# Patient Record
Sex: Male | Born: 1995
Health system: Southern US, Community
[De-identification: ages and names within clinical notes are randomized; demographics above are authoritative.]

## PROBLEM LIST (undated history)

## (undated) DIAGNOSIS — M2142 Flat foot [pes planus] (acquired), left foot: Secondary | ICD-10-CM

## (undated) DIAGNOSIS — F913 Oppositional defiant disorder: Secondary | ICD-10-CM

## (undated) DIAGNOSIS — M2141 Flat foot [pes planus] (acquired), right foot: Secondary | ICD-10-CM

## (undated) DIAGNOSIS — F909 Attention-deficit hyperactivity disorder, unspecified type: Secondary | ICD-10-CM

## (undated) DIAGNOSIS — Z973 Presence of spectacles and contact lenses: Secondary | ICD-10-CM

## (undated) HISTORY — DX: Presence of spectacles and contact lenses: Z97.3

## (undated) HISTORY — DX: Attention-deficit hyperactivity disorder, unspecified type: F90.9

## (undated) HISTORY — DX: Flat foot (pes planus) (acquired), left foot: M21.42

## (undated) HISTORY — DX: Oppositional defiant disorder: F91.3

## (undated) HISTORY — DX: Flat foot (pes planus) (acquired), right foot: M21.41

---
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
## 2018-02-27 ENCOUNTER — Encounter (HOSPITAL_COMMUNITY): Payer: Self-pay | Admitting: Emergency Medicine

## 2018-02-27 ENCOUNTER — Ambulatory Visit (HOSPITAL_COMMUNITY)
Admission: EM | Admit: 2018-02-27 | Discharge: 2018-02-27 | Disposition: A | Discharge status: Home / Self Care | Attending: Physician Assistant | Admitting: Physician Assistant

## 2018-02-27 DIAGNOSIS — L02214 Cutaneous abscess of groin: Secondary | ICD-10-CM

## 2018-02-27 MED ORDER — SULFAMETHOXAZOLE-TRIMETHOPRIM 800-160 MG PO TABS: 1.0000 | ORAL_TABLET | Freq: Two times a day (BID) | ORAL | 0 refills | Status: AC

## 2018-02-27 NOTE — ED Provider Notes (Signed)
[facility]    CSN: [id number] Arrival date & time: 02/27/18  1150     History   Chief Complaint Chief Complaint  Patient presents with  . Abscess    HPI Thomas Rasmussen is a 22 y.o. male.   The history is provided by the patient. No language interpreter was used.  Abscess  Location:  Pelvis Pelvic abscess location:  Groin Size:  3 Abscess quality: draining ( cm) and warmth   Red streaking: no   Progression:  Worsening Chronicity:  New Relieved by:  Nothing Worsened by:  Nothing Ineffective treatments:  None tried Pt reports he has a swollen area in his groin.   Past Medical History:  Diagnosis Date  . ADHD (attention deficit hyperactivity disorder)   . Asthma   . Flat feet   . Oppositional defiant disorder   . Wears glasses     Patient Active Problem List   Diagnosis Date Noted  . ADHD (attention deficit hyperactivity disorder), combined type 06/15/2011  . ODD (oppositional defiant disorder) 06/15/2011    History reviewed. No pertinent surgical history.     Home Medications    Prior to Admission medications   Medication Sig Start Date End Date Taking? Authorizing Provider  sulfamethoxazole-trimethoprim (BACTRIM DS,SEPTRA DS) 800-160 MG tablet Take 1 tablet by mouth 2 (two) times daily for 7 days. 02/27/18 03/06/18  Elson AreasSofia, Leslie K, PA-C    Family History History reviewed. No pertinent family history.  Social History Social History   Tobacco Use  . Smoking status: Never Smoker  Substance Use Topics  . Alcohol use: No  . Drug use: No     Allergies   Peanut-containing drug products   Review of Systems Review of Systems  All other systems reviewed and are negative.    Physical Exam Triage Vital Signs ED Triage Vitals [02/27/18 1230]  Enc Vitals Group     BP 133/81     Pulse Rate (!) 55     Resp 18     Temp 98.2 F (36.8 C)     Temp Source Oral     SpO2 100 %     Weight      Height      Head Circumference      Peak  Flow      Pain Score      Pain Loc      Pain Edu?      Excl. in GC?    No data found.  Updated Vital Signs BP 133/81 (BP Location: Left Arm)   Pulse (!) 55   Temp 98.2 F (36.8 C) (Oral)   Resp 18   SpO2 100%   Visual Acuity Right Eye Distance:   Left Eye Distance:   Bilateral Distance:    Right Eye Near:   Left Eye Near:    Bilateral Near:     Physical Exam  Constitutional: He appears well-developed and well-nourished.  Cardiovascular: Normal rate.  Pulmonary/Chest: Effort normal.  Musculoskeletal: Normal range of motion.  Neurological: He is alert.  Skin: Skin is warm.  Tender swollen area right groin in crease.  No fluctuance,   Psychiatric: He has a normal mood and affect.  Nursing note and vitals reviewed.    UC Treatments / Results  Labs (all labs ordered are listed, but only abnormal results are displayed) Labs Reviewed - No data to display  EKG None  Radiology No results found.  Procedures Procedures (including critical care time)  Medications Ordered  in UC Medications - No data to display  Initial Impression / Assessment and Plan / UC Course  I have reviewed the triage vital signs and the nursing notes.  Pertinent labs & imaging results that were available during my care of the patient were reviewed by me and considered in my medical decision making (see chart for details).     Pt counseled on soaking.  Rx for Bactrim  Final Clinical Impressions(s) / UC Diagnoses   Final diagnoses:  Abscess, groin     Discharge Instructions     Soak area 20 minutes 4 times a day  return if worsening.  Recheck in 2-3 days if not improving   ED Prescriptions    Medication Sig Dispense Auth. Provider   sulfamethoxazole-trimethoprim (BACTRIM DS,SEPTRA DS) 800-160 MG tablet Take 1 tablet by mouth 2 (two) times daily for 7 days. 20 tablet Elson AreasSofia, Leslie K, [place]     Controlled Substance Prescriptions [place] Controlled Substance Registry consulted? Not  Applicable  An After Visit Summary was printed and given to the patient.    Elson AreasSofia, Leslie K, [place] 02/27/18 1311

## 2018-02-27 NOTE — Discharge Instructions (Signed)
Soak area 20 minutes 4 times a day  return if worsening.  Recheck in 2-3 days if not improving

## 2018-02-27 NOTE — ED Triage Notes (Signed)
Pt sts abscess to right thigh with some drainage yesterday; pt sts swelling and pain

## 2020-04-26 ENCOUNTER — Ambulatory Visit (HOSPITAL_COMMUNITY)
Admission: EM | Admit: 2020-04-26 | Discharge: 2020-04-26 | Disposition: A | Discharge status: Home / Self Care | Attending: Family Medicine | Admitting: Family Medicine

## 2020-04-26 ENCOUNTER — Encounter (HOSPITAL_COMMUNITY): Payer: Self-pay

## 2020-04-26 DIAGNOSIS — L0291 Cutaneous abscess, unspecified: Secondary | ICD-10-CM

## 2020-04-26 MED ORDER — SULFAMETHOXAZOLE-TRIMETHOPRIM 800-160 MG PO TABS: 1.0000 | ORAL_TABLET | Freq: Two times a day (BID) | ORAL | 0 refills | Status: AC

## 2020-04-26 MED ORDER — LIDOCAINE-EPINEPHRINE 1 %-1:100000 IJ SOLN
INTRAMUSCULAR | Status: AC
  Filled 2020-04-26: qty 1

## 2020-04-26 NOTE — ED Triage Notes (Addendum)
Patient in with c/o of abscess on left buttock that was noticed last week. States that it was draining last week but it stopped. States that pain and swelling persist and pain is worse on palpation or if he sits  Denies n/v, fever,

## 2020-04-26 NOTE — Discharge Instructions (Signed)
Follow up with Primary Care in the next few weeks to re-check the area.

## 2020-04-26 NOTE — ED Provider Notes (Signed)
[facility]    CSN: [id number] Arrival date & time: 04/26/20  1136      History   Chief Complaint Chief Complaint  Patient presents with  . Abscess    HPI Thomas Rasmussen is a 24 y.o. male.   Here today with a painful abscess on left buttock that has been ongoing for about 2 weeks now. Was able to express some thick material 2 days ago but nothing since. Some redness, warmth but no fever, chills, sweats, body aches. Not trying anything OTC for sxs. Has had this issue once before.      Past Medical History:  Diagnosis Date  . ADHD (attention deficit hyperactivity disorder)   . Asthma   . Flat feet   . Oppositional defiant disorder   . Wears glasses     Patient Active Problem List   Diagnosis Date Noted  . ADHD (attention deficit hyperactivity disorder), combined type 06/15/2011  . ODD (oppositional defiant disorder) 06/15/2011    History reviewed. No pertinent surgical history.     Home Medications    Prior to Admission medications   Medication Sig Start Date End Date Taking? Authorizing Provider  sulfamethoxazole-trimethoprim (BACTRIM DS) 800-160 MG tablet Take 1 tablet by mouth 2 (two) times daily for 7 days. 04/26/20 05/03/20  Particia Nearing, PA-C    Family History Family History  Problem Relation Age of Onset  . Diabetes Father     Social History Social History   Tobacco Use  . Smoking status: Never Smoker  Substance Use Topics  . Alcohol use: Yes  . Drug use: No     Allergies   Peanut-containing drug products   Review of Systems Review of Systems PER HPI    Physical Exam Triage Vital Signs ED Triage Vitals  Enc Vitals Group     BP 04/26/20 1318 132/89     Pulse Rate 04/26/20 1318 (!) 58     Resp 04/26/20 1318 17     Temp 04/26/20 1318 98.2 F (36.8 C)     Temp Source 04/26/20 1318 Oral     SpO2 04/26/20 1318 100 %     Weight --      Height --      Head Circumference --      Peak Flow --      Pain Score  04/26/20 1313 6     Pain Loc --      Pain Edu? --      Excl. in GC? --    No data found.  Updated Vital Signs BP 132/89 (BP Location: Left Arm)   Pulse (!) 58   Temp 98.2 F (36.8 C) (Oral)   Resp 17   SpO2 100%   Visual Acuity Right Eye Distance:   Left Eye Distance:   Bilateral Distance:    Right Eye Near:   Left Eye Near:    Bilateral Near:     Physical Exam Vitals and nursing note reviewed.  Constitutional:      Appearance: Normal appearance.  HENT:     Head: Atraumatic.  Eyes:     Extraocular Movements: Extraocular movements intact.     Conjunctiva/sclera: Conjunctivae normal.  Cardiovascular:     Rate and Rhythm: Normal rate and regular rhythm.  Pulmonary:     Effort: Pulmonary effort is normal.     Breath sounds: Normal breath sounds.  Musculoskeletal:        General: Normal range of motion.     Cervical  back: Normal range of motion and neck supple.  Skin:    General: Skin is warm and dry.     Comments: 2.5 cm semi fluctuant lump left buttock, no erythema or active drainage.   Neurological:     General: No focal deficit present.     Mental Status: He is oriented to person, place, and time.  Psychiatric:        Mood and Affect: Mood normal.        Thought Content: Thought content normal.        Judgment: Judgment normal.      UC Treatments / Results  Labs (all labs ordered are listed, but only abnormal results are displayed) Labs Reviewed - No data to display  EKG   Radiology No results found.  Procedures Incision and Drainage  Date/Time: 04/26/2020 1:54 PM Performed by: Particia Nearing, PA-C Authorized by: Particia Nearing, PA-C   Consent:    Consent obtained:  Verbal   Consent given by:  Patient   Risks discussed:  Bleeding, incomplete drainage, infection and pain   Alternatives discussed:  Alternative treatment and referral Location:    Type:  Abscess   Location: left buttock. Pre-procedure details:    Skin  preparation:  Chloraprep Anesthesia (see MAR for exact dosages):    Anesthesia method:  Local infiltration   Local anesthetic:  Lidocaine 2% WITH epi Procedure type:    Complexity:  Simple Procedure details:    Needle aspiration: no     Incision types:  Stab incision   Incision depth:  Subcutaneous   Scalpel blade:  11   Wound management:  Probed and deloculated   Drainage:  Purulent and bloody   Drainage amount:  Copious   Wound treatment:  Wound left open   Packing materials:  None Post-procedure details:    Patient tolerance of procedure:  Tolerated well, no immediate complications   (including critical care time)  Medications Ordered in UC Medications - No data to display  Initial Impression / Assessment and Plan / UC Course  I have reviewed the triage vital signs and the nursing notes.  Pertinent labs & imaging results that were available during my care of the patient were reviewed by me and considered in my medical decision making (see chart for details).     Infected sebaceous cyst, drained as thoroughly as possible in clinic and sent home with bactrim and wound care instructions. F/u with PCP for full excision in the next few weeks. OTC pain relievers prn.   Final Clinical Impressions(s) / UC Diagnoses   Final diagnoses:  Abscess     Discharge Instructions     Follow up with Primary Care in the next few weeks to re-check the area.     ED Prescriptions    Medication Sig Dispense Auth. Provider   sulfamethoxazole-trimethoprim (BACTRIM DS) 800-160 MG tablet Take 1 tablet by mouth 2 (two) times daily for 7 days. 14 tablet Particia Nearing, [place]     PDMP not reviewed this encounter.   Particia Nearing, [place] 04/26/20 1357

## 2020-06-10 ENCOUNTER — Encounter (HOSPITAL_COMMUNITY): Payer: Self-pay | Admitting: Emergency Medicine

## 2020-06-10 ENCOUNTER — Other Ambulatory Visit: Payer: Self-pay

## 2020-06-10 ENCOUNTER — Ambulatory Visit (HOSPITAL_COMMUNITY)
Admission: EM | Admit: 2020-06-10 | Discharge: 2020-06-10 | Disposition: A | Discharge status: Home / Self Care | Payer: 59 | Attending: Family Medicine | Admitting: Family Medicine

## 2020-06-10 DIAGNOSIS — R070 Pain in throat: Secondary | ICD-10-CM | POA: Diagnosis not present

## 2020-06-10 DIAGNOSIS — Z20822 Contact with and (suspected) exposure to covid-19: Secondary | ICD-10-CM | POA: Insufficient documentation

## 2020-06-10 DIAGNOSIS — J069 Acute upper respiratory infection, unspecified: Secondary | ICD-10-CM

## 2020-06-10 DIAGNOSIS — J45909 Unspecified asthma, uncomplicated: Secondary | ICD-10-CM | POA: Insufficient documentation

## 2020-06-10 DIAGNOSIS — R0602 Shortness of breath: Secondary | ICD-10-CM | POA: Insufficient documentation

## 2020-06-10 LAB — SARS CORONAVIRUS 2 (TAT 6-24 HRS): SARS Coronavirus 2: NEGATIVE

## 2020-06-10 MED ORDER — BENZONATATE 100 MG PO CAPS: 100.0000 mg | ORAL_CAPSULE | Freq: Three times a day (TID) | ORAL | 0 refills | Status: DC | PRN

## 2020-06-10 MED ORDER — PSEUDOEPHEDRINE HCL 60 MG PO TABS: 60.0000 mg | ORAL_TABLET | Freq: Three times a day (TID) | ORAL | 0 refills | Status: AC | PRN

## 2020-06-10 MED ORDER — ALBUTEROL SULFATE HFA 108 (90 BASE) MCG/ACT IN AERS: 1.0000 | INHALATION_SPRAY | Freq: Four times a day (QID) | RESPIRATORY_TRACT | 0 refills | Status: AC | PRN

## 2020-06-10 MED ORDER — PROMETHAZINE-DM 6.25-15 MG/5ML PO SYRP: 5.0000 mL | ORAL_SOLUTION | Freq: Every evening | ORAL | 0 refills | Status: AC | PRN

## 2020-06-10 NOTE — ED Triage Notes (Signed)
Pt presents with productive cough and sob xs 2-3 days.

## 2020-06-10 NOTE — ED Provider Notes (Signed)
[facility]   MRN: [id number] DOB: 09/04/1995  Subjective:   Thomas Rasmussen is a 24 y.o. male presenting for 2 to 3-day history of acute onset malaise, fatigue, cough, intermittent shortness of breath, mild occasional chest pain and throat pain.  Patient has not used medications for relief.  He has a history of asthma but has not needed an inhaler in the past 5 to 6 years.  Denies any fever, sinus pain, ear pain, wheezing, belly pain, body aches, rashes.  Patient is Covid vaccinated.  He works for [employer] and is required to have note.  No current facility-administered medications for this encounter. No current outpatient medications on file.   Allergies  Allergen Reactions  . Peanut-Containing Drug Products Itching and Swelling    Past Medical History:  Diagnosis Date  . ADHD (attention deficit hyperactivity disorder)   . Asthma   . Flat feet   . Oppositional defiant disorder   . Wears glasses      History reviewed. No pertinent surgical history.  Family History  Problem Relation Age of Onset  . Diabetes Father     Social History   Tobacco Use  . Smoking status: Never Smoker  Substance Use Topics  . Alcohol use: Yes  . Drug use: No    ROS   Objective:   Vitals: BP 119/84 (BP Location: Left Arm)   Pulse 68   Temp 97.9 F (36.6 C) (Oral)   Resp 18   SpO2 98%   Physical Exam Constitutional:      General: He is not in acute distress.    Appearance: Normal appearance. He is well-developed and normal weight. He is not ill-appearing, toxic-appearing or diaphoretic.  HENT:     Head: Normocephalic and atraumatic.     Right Ear: Tympanic membrane, ear canal and external ear normal. There is no impacted cerumen.     Left Ear: Tympanic membrane, ear canal and external ear normal. There is no impacted cerumen.     Nose: Nose normal. No congestion or rhinorrhea.     Mouth/Throat:     Mouth: Mucous membranes are moist.     Pharynx: Oropharynx is  clear. No oropharyngeal exudate or posterior oropharyngeal erythema.  Eyes:     General: No scleral icterus.       Right eye: No discharge.        Left eye: No discharge.     Extraocular Movements: Extraocular movements intact.     Conjunctiva/sclera: Conjunctivae normal.     Pupils: Pupils are equal, round, and reactive to light.  Cardiovascular:     Rate and Rhythm: Normal rate and regular rhythm.     Heart sounds: Normal heart sounds. No murmur heard.  No friction rub. No gallop.   Pulmonary:     Effort: Pulmonary effort is normal. No respiratory distress.     Breath sounds: Normal breath sounds. No stridor. No wheezing, rhonchi or rales.  Musculoskeletal:     Cervical back: Normal range of motion and neck supple. No rigidity. No muscular tenderness.  Neurological:     General: No focal deficit present.     Mental Status: He is alert and oriented to person, place, and time.  Psychiatric:        Mood and Affect: Mood normal.        Behavior: Behavior normal.        Thought Content: Thought content normal.       Assessment and Plan :  PDMP not reviewed this encounter.  1. Viral URI with cough   2. Shortness of breath   3. Throat pain     Patient has reassuring physical exam findings, stable vital signs, will manage as an outpatient.  Low risk factors for PE.  Will manage for viral illness such as viral URI, viral syndrome, viral rhinitis, COVID-19. Counseled patient on nature of COVID-19 including modes of transmission, diagnostic testing, management and supportive care.  Offered scripts for symptomatic relief. COVID 19 testing is pending. Counseled patient on potential for adverse effects with medications prescribed/recommended today, ER and return-to-clinic precautions discussed, patient verbalized understanding.     Wallis Bamberg, PA-C 06/10/20 1051

## 2020-06-10 NOTE — Discharge Instructions (Signed)

## 2020-08-10 ENCOUNTER — Ambulatory Visit (HOSPITAL_COMMUNITY)
Admission: EM | Admit: 2020-08-10 | Discharge: 2020-08-10 | Disposition: A | Discharge status: Home / Self Care | Payer: HRSA Program | Attending: Emergency Medicine | Admitting: Emergency Medicine

## 2020-08-10 ENCOUNTER — Encounter (HOSPITAL_COMMUNITY): Payer: Self-pay | Admitting: *Deleted

## 2020-08-10 ENCOUNTER — Other Ambulatory Visit: Payer: Self-pay

## 2020-08-10 DIAGNOSIS — Z20822 Contact with and (suspected) exposure to covid-19: Secondary | ICD-10-CM | POA: Insufficient documentation

## 2020-08-10 DIAGNOSIS — J069 Acute upper respiratory infection, unspecified: Secondary | ICD-10-CM | POA: Insufficient documentation

## 2020-08-10 DIAGNOSIS — R0981 Nasal congestion: Secondary | ICD-10-CM | POA: Diagnosis present

## 2020-08-10 LAB — SARS CORONAVIRUS 2 (TAT 6-24 HRS): SARS Coronavirus 2: NEGATIVE

## 2020-08-10 MED ORDER — BENZONATATE 100 MG PO CAPS: 100.0000 mg | ORAL_CAPSULE | Freq: Three times a day (TID) | ORAL | 0 refills | Status: AC | PRN

## 2020-08-10 NOTE — ED Triage Notes (Signed)
Pt has had runny nose and cough for 2 days. Pt's friend tested positive 2 days ago . Pt here for COVID test.

## 2020-08-10 NOTE — Discharge Instructions (Signed)
Take Tessalon Perles for cough.

## 2020-08-10 NOTE — ED Provider Notes (Signed)
[facility]  ____________________________________________  Time seen: Approximately 4:00 PM  I have reviewed the triage vital signs and the nursing notes.   HISTORY  Chief Complaint Cough and Nasal Congestion   Historian Patient    HPI Thomas Rasmussen is a 25 y.o. male presents to the urgent care with rhinorrhea, nasal congestion nonproductive cough for the past 2 days.  Patient endorses tactile fever today.  He has had some chest discomfort only with coughing.  No shortness of breath or abdominal pain.  He states that he was recently exposed to a friend who tested positive for COVID-19.  No other alleviating measures have been attempted.   Past Medical History:  Diagnosis Date  . ADHD (attention deficit hyperactivity disorder)   . Asthma   . Flat feet   . Oppositional defiant disorder   . Wears glasses      Immunizations up to date:  Yes.     Past Medical History:  Diagnosis Date  . ADHD (attention deficit hyperactivity disorder)   . Asthma   . Flat feet   . Oppositional defiant disorder   . Wears glasses     Patient Active Problem List   Diagnosis Date Noted  . ADHD (attention deficit hyperactivity disorder), combined type 06/15/2011  . ODD (oppositional defiant disorder) 06/15/2011    History reviewed. No pertinent surgical history.  Prior to Admission medications   Medication Sig Start Date End Date Taking? Authorizing Provider  albuterol (VENTOLIN HFA) 108 (90 Base) MCG/ACT inhaler Inhale 1-2 puffs into the lungs every 6 (six) hours as needed for wheezing or shortness of breath. 06/10/20  Yes Wallis Bamberg, PA-C  benzonatate (TESSALON PERLES) 100 MG capsule Take 1 capsule (100 mg total) by mouth 3 (three) times daily as needed for up to 7 days for cough. 08/10/20 08/17/20 Yes Pia Mau M, PA-C  promethazine-dextromethorphan (PROMETHAZINE-DM) 6.25-15 MG/5ML syrup Take 5 mLs by mouth at bedtime as needed for cough. 06/10/20   Wallis Bamberg, PA-C   pseudoephedrine (SUDAFED) 60 MG tablet Take 1 tablet (60 mg total) by mouth every 8 (eight) hours as needed for congestion. 06/10/20   Wallis Bamberg, PA-C    Allergies Peanut-containing drug products  Family History  Problem Relation Age of Onset  . Diabetes Father     Social History Social History   Tobacco Use  . Smoking status: Never Smoker  . Smokeless tobacco: Never Used  Substance Use Topics  . Alcohol use: Yes  . Drug use: No      Review of Systems  Constitutional: Patient has fever.  Eyes: No visual changes. No discharge ENT: Patient has congestion.  Cardiovascular: no chest pain. Respiratory: Patient has cough.  Gastrointestinal: No abdominal pain.  No nausea, no vomiting. No diarrhea.  Genitourinary: Negative for dysuria. No hematuria Musculoskeletal: Patient has myalgias.  Skin: Negative for rash, abrasions, lacerations, ecchymosis. Neurological: Patient has headache, no focal weakness or numbness.      ____________________________________________   PHYSICAL EXAM:  VITAL SIGNS: ED Triage Vitals  Enc Vitals Group     BP 08/10/20 1541 (!) 144/101     Pulse Rate 08/10/20 1541 88     Resp 08/10/20 1541 16     Temp 08/10/20 1541 98.4 F (36.9 C)     Temp Source 08/10/20 1541 Oral     SpO2 08/10/20 1541 96 %     Weight --      Height --      Head Circumference --  Peak Flow --      Pain Score 08/10/20 1537 0     Pain Loc --      Pain Edu? --      Excl. in GC? --      Constitutional: Alert and oriented. Patient is lying supine. Eyes: Conjunctivae are normal. PERRL. EOMI. Head: Atraumatic. ENT:      Ears: Tympanic membranes are mildly injected with mild effusion bilaterally.       Nose: No congestion/rhinnorhea.      Mouth/Throat: Mucous membranes are moist. Posterior pharynx is mildly erythematous.  Hematological/Lymphatic/Immunilogical: No cervical lymphadenopathy.  Cardiovascular: Normal rate, regular rhythm. Normal S1 and S2.  Good  peripheral circulation. Respiratory: Normal respiratory effort without tachypnea or retractions. Lungs CTAB. Good air entry to the bases with no decreased or absent breath sounds. Gastrointestinal: Bowel sounds 4 quadrants. Soft and nontender to palpation. No guarding or rigidity. No palpable masses. No distention. No CVA tenderness. Musculoskeletal: Full range of motion to all extremities. No gross deformities appreciated. Neurologic:  Normal speech and language. No gross focal neurologic deficits are appreciated.  Skin:  Skin is warm, dry and intact. No rash noted. Psychiatric: Mood and affect are normal. Speech and behavior are normal. Patient exhibits appropriate insight and judgement.    ____________________________________________   LABS (all labs ordered are listed, but only abnormal results are displayed)  Labs Reviewed  SARS CORONAVIRUS 2 (TAT 6-24 HRS)   ____________________________________________  EKG   ____________________________________________  RADIOLOGY   No results found.  ____________________________________________    PROCEDURES  Procedure(s) performed:     Procedures     Medications - No data to display   ____________________________________________   INITIAL IMPRESSION / ASSESSMENT AND PLAN / ED COURSE  Pertinent labs & imaging results that were available during my care of the patient were reviewed by me and considered in my medical decision making (see chart for details).      Assessment and plan Viral URI 25 year old male presents to the urgent care with 2 days of nonproductive cough, nasal congestion and rhinorrhea for the past 2 days.  Patient was hypertensive at triage but vital signs were otherwise reassuring.  Patient felt comfortable waiting for his COVID-19 results at home.  Patient was discharged with [name] for cough.  Return precautions were given to return with new or worsening symptoms.  All patient questions  were answered.     ____________________________________________  FINAL CLINICAL IMPRESSION(S) / ED DIAGNOSES  Final diagnoses:  Viral upper respiratory tract infection      NEW MEDICATIONS STARTED DURING THIS VISIT:  ED Discharge Orders         Ordered    benzonatate (TESSALON PERLES) 100 MG capsule  3 times daily PRN        08/10/20 1553              This chart was dictated using voice recognition software/Dragon. Despite best efforts to proofread, errors can occur which can change the meaning. Any change was purely unintentional.     Orvil Feil, PA-C 08/10/20 1603

## 2021-04-06 ENCOUNTER — Encounter (HOSPITAL_COMMUNITY): Payer: Self-pay

## 2021-04-06 ENCOUNTER — Other Ambulatory Visit: Payer: Self-pay

## 2021-04-06 ENCOUNTER — Ambulatory Visit (HOSPITAL_COMMUNITY)
Admission: EM | Admit: 2021-04-06 | Discharge: 2021-04-06 | Disposition: A | Discharge status: Home / Self Care | Payer: Self-pay | Attending: Physician Assistant | Admitting: Physician Assistant

## 2021-04-06 DIAGNOSIS — J029 Acute pharyngitis, unspecified: Secondary | ICD-10-CM | POA: Insufficient documentation

## 2021-04-06 DIAGNOSIS — R1310 Dysphagia, unspecified: Secondary | ICD-10-CM | POA: Insufficient documentation

## 2021-04-06 DIAGNOSIS — R499 Unspecified voice and resonance disorder: Secondary | ICD-10-CM | POA: Insufficient documentation

## 2021-04-06 DIAGNOSIS — R509 Fever, unspecified: Secondary | ICD-10-CM | POA: Insufficient documentation

## 2021-04-06 DIAGNOSIS — J039 Acute tonsillitis, unspecified: Secondary | ICD-10-CM | POA: Insufficient documentation

## 2021-04-06 LAB — CBC WITH DIFFERENTIAL/PLATELET
Abs Immature Granulocytes: 0.1 10*3/uL — ABNORMAL HIGH (ref 0.00–0.07)
Basophils Absolute: 0.1 10*3/uL (ref 0.0–0.1)
Basophils Relative: 1 %
Eosinophils Absolute: 0.1 10*3/uL (ref 0.0–0.5)
Eosinophils Relative: 1 %
HCT: 38.4 % — ABNORMAL LOW (ref 39.0–52.0)
Hemoglobin: 11.1 g/dL — ABNORMAL LOW (ref 13.0–17.0)
Immature Granulocytes: 0 %
Lymphocytes Relative: 67 %
Lymphs Abs: 15.8 10*3/uL — ABNORMAL HIGH (ref 0.7–4.0)
MCH: 19.2 pg — ABNORMAL LOW (ref 26.0–34.0)
MCHC: 28.9 g/dL — ABNORMAL LOW (ref 30.0–36.0)
MCV: 66.3 fL — ABNORMAL LOW (ref 80.0–100.0)
Monocytes Absolute: 2.6 10*3/uL — ABNORMAL HIGH (ref 0.1–1.0)
Monocytes Relative: 11 %
Neutro Abs: 4.6 10*3/uL (ref 1.7–7.7)
Neutrophils Relative %: 20 %
Platelets: 184 10*3/uL (ref 150–400)
RBC: 5.79 MIL/uL (ref 4.22–5.81)
RDW: 18.6 % — ABNORMAL HIGH (ref 11.5–15.5)
WBC: 23.4 10*3/uL — ABNORMAL HIGH (ref 4.0–10.5)
nRBC: 0 % (ref 0.0–0.2)

## 2021-04-06 LAB — POCT RAPID STREP A, ED / UC: Streptococcus, Group A Screen (Direct): NEGATIVE

## 2021-04-06 LAB — COMPREHENSIVE METABOLIC PANEL
ALT: 269 U/L — ABNORMAL HIGH (ref 0–44)
AST: 228 U/L — ABNORMAL HIGH (ref 15–41)
Albumin: 3.5 g/dL (ref 3.5–5.0)
Alkaline Phosphatase: 210 U/L — ABNORMAL HIGH (ref 38–126)
Anion gap: 12 (ref 5–15)
BUN: 8 mg/dL (ref 6–20)
CO2: 19 mmol/L — ABNORMAL LOW (ref 22–32)
Calcium: 8.7 mg/dL — ABNORMAL LOW (ref 8.9–10.3)
Chloride: 101 mmol/L (ref 98–111)
Creatinine, Ser: 1.42 mg/dL — ABNORMAL HIGH (ref 0.61–1.24)
GFR, Estimated: >60 mL/min (ref >60)
Glucose, Bld: 100 mg/dL — ABNORMAL HIGH (ref 70–99)
Potassium: 4.7 mmol/L (ref 3.5–5.1)
Sodium: 132 mmol/L — ABNORMAL LOW (ref 135–145)
Total Bilirubin: 0.7 mg/dL (ref 0.3–1.2)
Total Protein: 7.9 g/dL (ref 6.5–8.1)

## 2021-04-06 MED ORDER — ACETAMINOPHEN 160 MG/5ML PO SOLN
960.0000 mg | Freq: Once | ORAL | Status: AC
  Administered 2021-04-06: 960 mg via ORAL

## 2021-04-06 MED ORDER — ACETAMINOPHEN 160 MG/5ML PO SUSP
ORAL | Status: AC
  Filled 2021-04-06: qty 30

## 2021-04-06 MED ORDER — METHYLPREDNISOLONE SODIUM SUCC 125 MG IJ SOLR
INTRAMUSCULAR | Status: AC
  Filled 2021-04-06: qty 2

## 2021-04-06 MED ORDER — METHYLPREDNISOLONE SODIUM SUCC 125 MG IJ SOLR
60.0000 mg | Freq: Once | INTRAMUSCULAR | Status: AC
  Administered 2021-04-06: 60 mg via INTRAMUSCULAR

## 2021-04-06 MED ORDER — CEFTRIAXONE SODIUM 1 G IJ SOLR
1.0000 g | Freq: Once | INTRAMUSCULAR | Status: AC
  Administered 2021-04-06: 1 g via INTRAMUSCULAR

## 2021-04-06 MED ORDER — AMOXICILLIN-POT CLAVULANATE 400-57 MG/5ML PO SUSR: 875.0000 mg | Freq: Two times a day (BID) | ORAL | 0 refills | Status: AC

## 2021-04-06 MED ORDER — CEFTRIAXONE SODIUM 1 G IJ SOLR
INTRAMUSCULAR | Status: AC
  Filled 2021-04-06: qty 10

## 2021-04-06 MED ORDER — LIDOCAINE HCL (PF) 1 % IJ SOLN
INTRAMUSCULAR | Status: AC
  Filled 2021-04-06: qty 2

## 2021-04-06 NOTE — ED Triage Notes (Signed)
Pt presents with c/o dry mouth, throat swelling, and trouble swallowing  x 3 days.   Pt states he feels his throat is closing. Denies allergic reaction. States he is allergic to peanuts but has not had anything with peanuts in it lately. Pt states he took NyQuil to help him sleep at night. States it did not help.

## 2021-04-06 NOTE — ED Provider Notes (Addendum)
[facility]    CSN: [id number] Arrival date & time: 04/06/21  1045      History   Chief Complaint Chief Complaint  Patient presents with   Sore Throat    HPI Thomas Rasmussen is a 25 y.o. male.   Patient presents today with a 3-day history of sore throat.  Pain is rated 5 on a 0-10 pain scale, localized to posterior oropharynx, described as aching cardiac sharp pains, worse with swallowing, no alleviating factors identified.  He has tried NyQuil without improvement in symptoms.  Reports that he is able to swallow and has been managing his own secretions but that this is very painful so he has been subsisting primarily on liquids.  He denies any known sick contacts.  Denies any recent antibiotic use.  He does have a history of severe allergy to peanuts but denies any recent exposure to peanut containing compounds.  He denies feeling as though he cannot breathe but does report associated muffled voice.    Past Medical History:  Diagnosis Date   ADHD (attention deficit hyperactivity disorder)    Asthma    Flat feet    Oppositional defiant disorder    Wears glasses     Patient Active Problem List   Diagnosis Date Noted   ADHD (attention deficit hyperactivity disorder), combined type 06/15/2011   ODD (oppositional defiant disorder) 06/15/2011    History reviewed. No pertinent surgical history.     Home Medications    Prior to Admission medications   Medication Sig Start Date End Date Taking? Authorizing Provider  amoxicillin-clavulanate (AUGMENTIN) 400-57 MG/5ML suspension Take 10.9 mLs (875 mg total) by mouth 2 (two) times daily for 10 days. 04/06/21 04/16/21 Yes Thamara Leger K, PA-C  albuterol (VENTOLIN HFA) 108 (90 Base) MCG/ACT inhaler Inhale 1-2 puffs into the lungs every 6 (six) hours as needed for wheezing or shortness of breath. 06/10/20   Wallis Bamberg, PA-C  promethazine-dextromethorphan (PROMETHAZINE-DM) 6.25-15 MG/5ML syrup Take 5 mLs by mouth at bedtime as  needed for cough. 06/10/20   Wallis Bamberg, PA-C  pseudoephedrine (SUDAFED) 60 MG tablet Take 1 tablet (60 mg total) by mouth every 8 (eight) hours as needed for congestion. 06/10/20   Wallis Bamberg, PA-C    Family History Family History  Problem Relation Age of Onset   Diabetes Father     Social History Social History   Tobacco Use   Smoking status: Never   Smokeless tobacco: Never  Substance Use Topics   Alcohol use: Yes   Drug use: No     Allergies   Peanut-containing drug products   Review of Systems Review of Systems  Constitutional:  Positive for activity change, appetite change, fatigue and fever.  HENT:  Positive for congestion, sore throat, trouble swallowing and voice change. Negative for sinus pressure and sneezing.   Respiratory:  Negative for cough and shortness of breath.   Cardiovascular:  Negative for chest pain.  Gastrointestinal:  Negative for abdominal pain, diarrhea, nausea and vomiting.  Neurological:  Negative for dizziness, light-headedness and headaches.    Physical Exam Triage Vital Signs ED Triage Vitals  Enc Vitals Group     BP 04/06/21 1050 (!) 141/82     Pulse Rate 04/06/21 1050 (!) 130     Resp 04/06/21 1050 (!) 21     Temp 04/06/21 1050 (!) 102.7 F (39.3 C)     Temp Source 04/06/21 1050 Oral     SpO2 04/06/21 1050 97 %  Weight --      Height --      Head Circumference --      Peak Flow --      Pain Score 04/06/21 1048 5     Pain Loc --      Pain Edu? --      Excl. in GC? --    No data found.  Updated Vital Signs BP 124/80 (BP Location: Left Arm)   Pulse (!) 116   Temp (!) 101.9 F (38.8 C) (Oral)   Resp 20   SpO2 97%   Visual Acuity Right Eye Distance:   Left Eye Distance:   Bilateral Distance:    Right Eye Near:   Left Eye Near:    Bilateral Near:     Physical Exam Vitals reviewed.  Constitutional:      General: He is awake.     Appearance: Normal appearance. He is well-developed and normal weight. He is  not ill-appearing.     Comments: Very pleasant male appears stated age in no acute distress sitting comfortably in exam room  HENT:     Head: Normocephalic and atraumatic.     Right Ear: Tympanic membrane, ear canal and external ear normal. Tympanic membrane is not erythematous or bulging.     Left Ear: Tympanic membrane, ear canal and external ear normal. Tympanic membrane is not erythematous or bulging.     Nose: Nose normal.     Mouth/Throat:     Pharynx: Uvula midline. Posterior oropharyngeal erythema present. No oropharyngeal exudate.     Tonsils: Tonsillar exudate present. No tonsillar abscesses. 3+ on the right. 3+ on the left.  Cardiovascular:     Rate and Rhythm: Regular rhythm. Tachycardia present.     Heart sounds: Normal heart sounds, S1 normal and S2 normal. No murmur heard. Pulmonary:     Effort: Pulmonary effort is normal. No accessory muscle usage or respiratory distress.     Breath sounds: Normal breath sounds. No stridor. No wheezing, rhonchi or rales.     Comments: Clear to auscultation bilaterally Abdominal:     General: Bowel sounds are normal.     Palpations: Abdomen is soft.     Tenderness: There is no abdominal tenderness.  Lymphadenopathy:     Head:     Right side of head: No submental, submandibular or tonsillar adenopathy.     Left side of head: No submental, submandibular or tonsillar adenopathy.     Cervical: No cervical adenopathy.  Neurological:     Mental Status: He is alert.  Psychiatric:        Behavior: Behavior is cooperative.     UC Treatments / Results  Labs (all labs ordered are listed, but only abnormal results are displayed) Labs Reviewed  CULTURE, GROUP A STREP ([facility])  CBC WITH DIFFERENTIAL/PLATELET  COMPREHENSIVE METABOLIC PANEL  POCT RAPID STREP A, ED / UC    EKG   Radiology No results found.  Procedures Procedures (including critical care time)  Medications Ordered in UC Medications  acetaminophen (TYLENOL) 160 MG/5ML  solution 960 mg (960 mg Oral Given 04/06/21 1113)  methylPREDNISolone sodium succinate (SOLU-MEDROL) 125 mg/2 mL injection 60 mg (60 mg Intramuscular Given 04/06/21 1121)  cefTRIAXone (ROCEPHIN) injection 1 g (1 g Intramuscular Given 04/06/21 1122)    Initial Impression / Assessment and Plan / UC Course  I have reviewed the triage vital signs and the nursing notes.  Pertinent labs & imaging results that were available during my care of the  patient were reviewed by me and considered in my medical decision making (see chart for details).     Patient is managing his own secretion but does have significant fever with muffled voice.  Discussed that it is reasonable for him to go to the emergency room but he declined this today.  He was given Tylenol, Solu-Medrol, Rocephin in clinic today.  He does report improvement of swelling sensation with this medication regimen and had improvement of fever.  He was started on Augmentin twice daily.  He was instructed to follow-up with ENT as soon as possible and given their contact information to schedule an appointment.  We discussed at length that if he has worsening symptoms including difficulty swallowing, shortness of breath, increased sensation of swelling he needs to go to the emergency room.  CBC and CMP were obtained today-results pending.  Discussed that if he has significant leukocytosis on CBC would need to go to the emergency room.  At the end of visit, patient reported that he was feeling better and declined going to the emergency room.  Strict return precautions given to which he expressed understanding.  Discussed that if he had difficulty seeing ENT within 24 to 48 hours he should be reevaluated by our clinic assuming improvement with current treatment plan.   Final Clinical Impressions(s) / UC Diagnoses   Final diagnoses:  Sore throat  Change in voice  Odynophagia  Tonsillitis  Fever, unspecified     Discharge Instructions      I am  concerned that you have a significant infection of your tonsils.  We have given you steroids and an antibiotic in clinic today.  Please start Augmentin twice daily to continue treating this infection.  We will contact you if your blood work is abnormal; if you have a significantly high white blood cell count you need to go to the emergency room.  Alternate Tylenol and ibuprofen for fever and pain.  If your symptoms worsen you develop a high fever that is not responding to medication, you cannot swallow including your own saliva, your pain worsens you need to go to the emergency room as we discussed.  Please call to schedule an appointment with an ear nose and throat doctor soon as possible.  If symptoms worsen please go to the emergency room as we discussed.     ED Prescriptions     Medication Sig Dispense Auth. Provider   amoxicillin-clavulanate (AUGMENTIN) 400-57 MG/5ML suspension Take 10.9 mLs (875 mg total) by mouth 2 (two) times daily for 10 days. 220 mL Blayde Bacigalupi K, PA-C      PDMP not reviewed this encounter.   Jeani Hawking, PA-C 04/06/21 1210    Bathsheba Durrett, Noberto Retort, PA-C 04/06/21 1224

## 2021-04-06 NOTE — ED Triage Notes (Signed)
Chief complaint reported to Medical Provider, Dorann Ou PA .

## 2021-04-06 NOTE — Discharge Instructions (Signed)
I am concerned that you have a significant infection of your tonsils.  We have given you steroids and an antibiotic in clinic today.  Please start Augmentin twice daily to continue treating this infection.  We will contact you if your blood work is abnormal; if you have a significantly high white blood cell count you need to go to the emergency room.  Alternate Tylenol and ibuprofen for fever and pain.  If your symptoms worsen you develop a high fever that is not responding to medication, you cannot swallow including your own saliva, your pain worsens you need to go to the emergency room as we discussed.  Please call to schedule an appointment with an ear nose and throat doctor soon as possible.  If symptoms worsen please go to the emergency room as we discussed.

## 2021-04-07 LAB — PATHOLOGIST SMEAR REVIEW: Path Review: REACTIVE

## 2021-04-08 ENCOUNTER — Encounter (HOSPITAL_COMMUNITY): Payer: Self-pay | Admitting: Emergency Medicine

## 2021-04-08 ENCOUNTER — Emergency Department (HOSPITAL_COMMUNITY): Payer: Self-pay

## 2021-04-08 ENCOUNTER — Emergency Department (HOSPITAL_COMMUNITY)
Admission: EM | Admit: 2021-04-08 | Discharge: 2021-04-08 | Disposition: A | Discharge status: Home / Self Care | Payer: Self-pay | Attending: Emergency Medicine | Admitting: Emergency Medicine

## 2021-04-08 DIAGNOSIS — R519 Headache, unspecified: Secondary | ICD-10-CM | POA: Insufficient documentation

## 2021-04-08 DIAGNOSIS — R791 Abnormal coagulation profile: Secondary | ICD-10-CM | POA: Insufficient documentation

## 2021-04-08 DIAGNOSIS — R7401 Elevation of levels of liver transaminase levels: Secondary | ICD-10-CM

## 2021-04-08 DIAGNOSIS — R1310 Dysphagia, unspecified: Secondary | ICD-10-CM | POA: Insufficient documentation

## 2021-04-08 DIAGNOSIS — J029 Acute pharyngitis, unspecified: Secondary | ICD-10-CM | POA: Insufficient documentation

## 2021-04-08 DIAGNOSIS — R509 Fever, unspecified: Secondary | ICD-10-CM | POA: Insufficient documentation

## 2021-04-08 DIAGNOSIS — J45909 Unspecified asthma, uncomplicated: Secondary | ICD-10-CM | POA: Insufficient documentation

## 2021-04-08 DIAGNOSIS — R197 Diarrhea, unspecified: Secondary | ICD-10-CM | POA: Insufficient documentation

## 2021-04-08 DIAGNOSIS — Z20822 Contact with and (suspected) exposure to covid-19: Secondary | ICD-10-CM | POA: Insufficient documentation

## 2021-04-08 DIAGNOSIS — R059 Cough, unspecified: Secondary | ICD-10-CM | POA: Insufficient documentation

## 2021-04-08 DIAGNOSIS — Z91010 Allergy to peanuts: Secondary | ICD-10-CM | POA: Insufficient documentation

## 2021-04-08 DIAGNOSIS — R000 Tachycardia, unspecified: Secondary | ICD-10-CM | POA: Insufficient documentation

## 2021-04-08 DIAGNOSIS — B2790 Infectious mononucleosis, unspecified without complication: Secondary | ICD-10-CM

## 2021-04-08 LAB — CBC WITH DIFFERENTIAL/PLATELET
Abs Immature Granulocytes: 0 10*3/uL (ref 0.00–0.07)
Basophils Absolute: 0.6 10*3/uL — ABNORMAL HIGH (ref 0.0–0.1)
Basophils Relative: 2 %
Blasts: 6 %
Eosinophils Absolute: 0 10*3/uL (ref 0.0–0.5)
Eosinophils Relative: 0 %
HCT: 40.2 % (ref 39.0–52.0)
Hemoglobin: 11.7 g/dL — ABNORMAL LOW (ref 13.0–17.0)
Lymphocytes Relative: 57 %
Lymphs Abs: 16.2 10*3/uL — ABNORMAL HIGH (ref 0.7–4.0)
MCH: 19.6 pg — ABNORMAL LOW (ref 26.0–34.0)
MCHC: 29.1 g/dL — ABNORMAL LOW (ref 30.0–36.0)
MCV: 67.2 fL — ABNORMAL LOW (ref 80.0–100.0)
Monocytes Absolute: 2.8 10*3/uL — ABNORMAL HIGH (ref 0.1–1.0)
Monocytes Relative: 10 %
Neutro Abs: 7.1 10*3/uL (ref 1.7–7.7)
Neutrophils Relative %: 25 %
Platelets: 199 10*3/uL (ref 150–400)
RBC: 5.98 MIL/uL — ABNORMAL HIGH (ref 4.22–5.81)
RDW: 19.2 % — ABNORMAL HIGH (ref 11.5–15.5)
WBC: 28.4 10*3/uL — ABNORMAL HIGH (ref 4.0–10.5)
nRBC: 0 % (ref 0.0–0.2)
nRBC: 0 /100 WBC

## 2021-04-08 LAB — COMPREHENSIVE METABOLIC PANEL
ALT: 228 U/L — ABNORMAL HIGH (ref 0–44)
AST: 147 U/L — ABNORMAL HIGH (ref 15–41)
Albumin: 3.5 g/dL (ref 3.5–5.0)
Alkaline Phosphatase: 212 U/L — ABNORMAL HIGH (ref 38–126)
Anion gap: 12 (ref 5–15)
BUN: 12 mg/dL (ref 6–20)
CO2: 20 mmol/L — ABNORMAL LOW (ref 22–32)
Calcium: 8.9 mg/dL (ref 8.9–10.3)
Chloride: 100 mmol/L (ref 98–111)
Creatinine, Ser: 1.53 mg/dL — ABNORMAL HIGH (ref 0.61–1.24)
GFR, Estimated: >60 mL/min (ref >60)
Glucose, Bld: 93 mg/dL (ref 70–99)
Potassium: 4.2 mmol/L (ref 3.5–5.1)
Sodium: 132 mmol/L — ABNORMAL LOW (ref 135–145)
Total Bilirubin: 0.9 mg/dL (ref 0.3–1.2)
Total Protein: 8.1 g/dL (ref 6.5–8.1)

## 2021-04-08 LAB — RESP PANEL BY RT-PCR (FLU A&B, COVID) ARPGX2
Influenza A by PCR: NEGATIVE
Influenza B by PCR: NEGATIVE
SARS Coronavirus 2 by RT PCR: NEGATIVE

## 2021-04-08 LAB — LACTIC ACID, PLASMA: Lactic Acid, Venous: 1.8 mmol/L (ref 0.5–1.9)

## 2021-04-08 LAB — MONONUCLEOSIS SCREEN: Mono Screen: POSITIVE — AB

## 2021-04-08 LAB — CULTURE, GROUP A STREP (THRC)

## 2021-04-08 LAB — PROTIME-INR
INR: 1.1 (ref 0.8–1.2)
Prothrombin Time: 14.4 seconds (ref 11.4–15.2)

## 2021-04-08 LAB — APTT: aPTT: 44 seconds — ABNORMAL HIGH (ref 24–36)

## 2021-04-08 MED ORDER — LIDOCAINE VISCOUS HCL 2 % MT SOLN
15.0000 mL | Freq: Once | OROMUCOSAL | Status: AC
  Administered 2021-04-08: 15 mL via OROMUCOSAL
  Filled 2021-04-08: qty 15

## 2021-04-08 MED ORDER — IBUPROFEN 600 MG PO TABS: 600.0000 mg | ORAL_TABLET | Freq: Four times a day (QID) | ORAL | 0 refills | Status: AC | PRN

## 2021-04-08 MED ORDER — LIDOCAINE VISCOUS HCL 2 % MT SOLN: 15.0000 mL | OROMUCOSAL | 0 refills | Status: AC | PRN

## 2021-04-08 MED ORDER — ACETAMINOPHEN 500 MG PO TABS
1000.0000 mg | ORAL_TABLET | Freq: Once | ORAL | Status: AC
  Administered 2021-04-08: 1000 mg via ORAL
  Filled 2021-04-08: qty 2

## 2021-04-08 MED ORDER — SODIUM CHLORIDE 0.9 % IV BOLUS
1000.0000 mL | Freq: Once | INTRAVENOUS | Status: AC
  Administered 2021-04-08: 1000 mL via INTRAVENOUS

## 2021-04-08 NOTE — ED Triage Notes (Signed)
Pt reports 5 days of sore throat, fever, cough. WBC elevated at 25. Pt went to UC 3 days ago no improvement. Pt awake, alert, appropriate.

## 2021-04-08 NOTE — ED Provider Notes (Signed)
[facility] EMERGENCY DEPARTMENT Provider Note   CSN: [id number] Arrival date & time: 04/08/21  1200     History Chief Complaint  Patient presents with   Cough   Fever   Sore Throat    Thomas Rasmussen is a 25 y.o. male.  The history is provided by the patient and medical records. No language interpreter was used.  Cough Associated symptoms: fever   Fever Associated symptoms: cough   Sore Throat   25 year old male significant history of ADHD, asthma, who presents for evaluation of cold symptoms.  Patient report for the past 4 to 5 days he has been having fever, chills, headache, sore throat, dry cough, having diarrhea and difficulty swallowing.  Symptom is moderate in severity.  He was initially seen at urgent care center for his complaint about 3 days ago.  He was diagnosed with tonsillitis, was given Augmentin as well as prednisone.  Despite taking medication he noticed no improvement.  He has been vaccinated for COVID-19.  He denies any shortness of breath, abdominal pain, or dysuria.  No recent sick contact.     Past Medical History:  Diagnosis Date   ADHD (attention deficit hyperactivity disorder)    Asthma    Flat feet    Oppositional defiant disorder    Wears glasses     Patient Active Problem List   Diagnosis Date Noted   ADHD (attention deficit hyperactivity disorder), combined type 06/15/2011   ODD (oppositional defiant disorder) 06/15/2011    History reviewed. No pertinent surgical history.     Family History  Problem Relation Age of Onset   Diabetes Father     Social History   Tobacco Use   Smoking status: Never   Smokeless tobacco: Never  Substance Use Topics   Alcohol use: Yes   Drug use: No    Home Medications Prior to Admission medications   Medication Sig Start Date End Date Taking? Authorizing Provider  albuterol (VENTOLIN HFA) 108 (90 Base) MCG/ACT inhaler Inhale 1-2 puffs into the lungs every 6 (six) hours as  needed for wheezing or shortness of breath. 06/10/20   Wallis Bamberg, PA-C  amoxicillin-clavulanate (AUGMENTIN) 400-57 MG/5ML suspension Take 10.9 mLs (875 mg total) by mouth 2 (two) times daily for 10 days. 04/06/21 04/16/21  Raspet, Noberto Retort, PA-C  promethazine-dextromethorphan (PROMETHAZINE-DM) 6.25-15 MG/5ML syrup Take 5 mLs by mouth at bedtime as needed for cough. 06/10/20   Wallis Bamberg, PA-C  pseudoephedrine (SUDAFED) 60 MG tablet Take 1 tablet (60 mg total) by mouth every 8 (eight) hours as needed for congestion. 06/10/20   Wallis Bamberg, PA-C    Allergies    Peanut-containing drug products  Review of Systems   Review of Systems  Constitutional:  Positive for fever.  Respiratory:  Positive for cough.   All other systems reviewed and are negative.  Physical Exam Updated Vital Signs BP 112/76 (BP Location: Left Arm)   Pulse 100   Temp (!) 101.1 F (38.4 C) (Oral)   Resp 20   SpO2 94%   Physical Exam Vitals and nursing note reviewed.  Constitutional:      General: He is not in acute distress.    Appearance: He is well-developed.  HENT:     Head: Atraumatic.     Right Ear: Tympanic membrane normal.     Left Ear: Tympanic membrane normal.     Nose: No congestion or rhinorrhea.     Mouth/Throat:     Tonsils: 2+ on the  right. 2+ on the left.     Comments: Uvula midline, bilateral tonsillar enlargement without exudates, posterior oropharyngeal erythema noted.  No trismus. Eyes:     Conjunctiva/sclera: Conjunctivae normal.  Cardiovascular:     Rate and Rhythm: Tachycardia present.  Pulmonary:     Effort: Pulmonary effort is normal.     Breath sounds: Normal breath sounds. No wheezing, rhonchi or rales.  Abdominal:     Palpations: Abdomen is soft.     Tenderness: There is no abdominal tenderness.     Comments: No splenomegaly  Musculoskeletal:     Cervical back: Neck supple.  Lymphadenopathy:     Cervical: Cervical adenopathy (Anterior cervical lymphadenopathy noted) present.   Skin:    Findings: No rash.  Neurological:     Mental Status: He is alert.  Psychiatric:        Mood and Affect: Mood normal.    ED Results / Procedures / Treatments   Labs (all labs ordered are listed, but only abnormal results are displayed) Labs Reviewed  MONONUCLEOSIS SCREEN - Abnormal; Notable for the following components:      Result Value   Mono Screen POSITIVE (*)    All other components within normal limits  COMPREHENSIVE METABOLIC PANEL - Abnormal; Notable for the following components:   Sodium 132 (*)    CO2 20 (*)    Creatinine, Ser 1.53 (*)    AST 147 (*)    ALT 228 (*)    Alkaline Phosphatase 212 (*)    All other components within normal limits  CBC WITH DIFFERENTIAL/PLATELET - Abnormal; Notable for the following components:   WBC 28.4 (*)    RBC 5.98 (*)    Hemoglobin 11.7 (*)    MCV 67.2 (*)    MCH 19.6 (*)    MCHC 29.1 (*)    RDW 19.2 (*)    Lymphs Abs 16.2 (*)    Monocytes Absolute 2.8 (*)    Basophils Absolute 0.6 (*)    All other components within normal limits  APTT - Abnormal; Notable for the following components:   aPTT 44 (*)    All other components within normal limits  RESP PANEL BY RT-PCR (FLU A&B, COVID) ARPGX2  CULTURE, BLOOD (SINGLE)  URINE CULTURE  LACTIC ACID, PLASMA  PROTIME-INR  URINALYSIS, ROUTINE W REFLEX MICROSCOPIC    EKG None ED ECG REPORT   Date: 04/08/2021  Rate: 132  Rhythm: sinus tachycardia  QRS Axis: right  Intervals: normal  ST/T Wave abnormalities: early repolarization  Conduction Disutrbances:none  Narrative Interpretation:   Old EKG Reviewed: none available  I have personally reviewed the EKG tracing and agree with the computerized printout as noted.   Radiology DG Chest 1 View  Result Date: 04/08/2021 CLINICAL DATA:  Cough, fever. EXAM: CHEST  1 VIEW COMPARISON:  May 18, 2008. FINDINGS: The heart size and mediastinal contours are within normal limits. Both lungs are clear. The visualized  skeletal structures are unremarkable. IMPRESSION: No active disease. Electronically Signed   By: Lupita Raider M.D.   On: 04/08/2021 13:34    Procedures Procedures   Medications Ordered in ED Medications  acetaminophen (TYLENOL) tablet 1,000 mg (1,000 mg Oral Given 04/08/21 1243)    ED Course  I have reviewed the triage vital signs and the nursing notes.  Pertinent labs & imaging results that were available during my care of the patient were reviewed by me and considered in my medical decision making (see chart for details).  MDM Rules/Calculators/A&P                           BP (!) 153/87   Pulse 98   Temp 98.1 F (36.7 C) (Oral)   Resp 17   SpO2 98%   Final Clinical Impression(s) / ED Diagnoses Final diagnoses:  Cough    Rx / DC Orders ED Discharge Orders     None      3:39 PM Patient here with cold symptoms including sore throat.  Was seen at urgent care center 3 days ago for his complaint, was prescribed Augmentin and prednisone but report no improvement.  Today he does have an elevated temperature of 101.1 and is tachycardic with heart rate of 100.  Work-up today remarkable for a positive monoscreen.  Furthermore, he does have evidence of AKI as well as transaminitis.  It appears he has had similar abnormal liver function from previous labs.  He also has a leukocytosis with a WBC of 28.4.  I suspect this is likely secondary to ongoing infection as well as recent day taking prednisone.  He has normal lactic acid.  COVID test negative, chest x-ray unremarkable.  Will give IV fluid as well as viscous lidocaine.  With abnormal liver of liver function, recommended against taking Tylenol for fever reducer.  4:35 PM After receiving IV fluid, tachycardia resolved.  At this time patient stable to be discharged home with symptomatic treatment.  Return precaution given.  Encourage patient to avoid any contact sport to decrease risk of potential splenic injury.   Fayrene Helper, PA-C 04/08/21 1637    Gerhard Munch, MD 04/10/21 2329

## 2021-04-08 NOTE — ED Notes (Signed)
Pt ambulatory at d/c. GCS 15. VSS. Steady on feet.

## 2021-04-08 NOTE — ED Notes (Signed)
I introduced myself to pt. Pt here with 5 days of throat swelling, fevers, general malaise and intermittent vertigo. Pt was prescribed antibx and was given an IM shot of steroids. Pt hasn't been taking tylenol or ibuprofen for fevers. Pt oriented x4. Throat swollen and reddened. Pt sounds stuffy. GCS 15. AxO x4.

## 2021-04-08 NOTE — ED Provider Notes (Signed)
Emergency Medicine Provider Triage Evaluation Note  Thomas Rasmussen , a 25 y.o. male  was evaluated in triage.  Pt complains of sore throat, trouble swallowing, nonproductive cough, fevers, chills, nasal congestion, rhinorrhea.  Reports symptoms started 5 days prior.  Patient was seen at urgent care on 9/19.  Patient had negative rapid strep and strep culture.  Patient received ceftriaxone and prednisone.  Was started on Augmentin.  Patient reports worsening of his symptoms.  Review of Systems  Positive: sore throat, trouble swallowing, nonproductive cough, fevers, chills, nasal congestion, rhinorrhea Negative: Drooling, trismus, nausea, vomiting  Physical Exam  BP (!) 131/97 (BP Location: Right Arm)   Pulse (!) 143   Temp (!) 101.1 F (38.4 C) (Oral)   Resp 18   SpO2 99%  Gen:   Awake, no distress   Resp:  Normal effort, lungs clear to auscultation bilaterally MSK:   Moves extremities without difficulty  Other:  Cervical lymphadenopathy.  Patient able to handle oral secretions.  Swelling and erythema noted to tonsils bilaterally.  No peritonsillar abscess.  No swelling to oral mucosa.  Medical Decision Making  Medically screening exam initiated at 12:41 PM.  Appropriate orders placed.  Thomas Rasmussen was informed that the remainder of the evaluation will be completed by another provider, this initial triage assessment does not replace that evaluation, and the importance of remaining in the ED until their evaluation is complete.  Patient noted to be tachycardic and febrile.  Lab work obtained on 9/19 showed leukocytosis at 23,000.  ED evolving sepsis work-up ordered at this time.   Berneice Heinrich 04/08/21 1247    Gerhard Munch, MD 04/10/21 2329

## 2021-04-08 NOTE — Discharge Instructions (Addendum)
You have been diagnosed with mono.  Please stay hydrated, drink plenty of fluid and get plenty of rest.  You may take ibuprofen as needed for fevers and aches.  Use viscous lidocaine to help soothe your throat.  Your liver enzymes are elevated today, it would be reasonable to follow-up closely with your primary care doctor for recheck.  Return if you have any concern.

## 2021-04-13 LAB — CULTURE, BLOOD (SINGLE)
Culture: NO GROWTH
Special Requests: ADEQUATE

## 2023-02-24 IMAGING — DX DG CHEST 1V
1 series · 1 of 1 positions shown · non-contrast
Comparison: May 18, 2008.

CLINICAL DATA: Cough, fever.

EXAM:
CHEST  1 VIEW

[w chest pa]
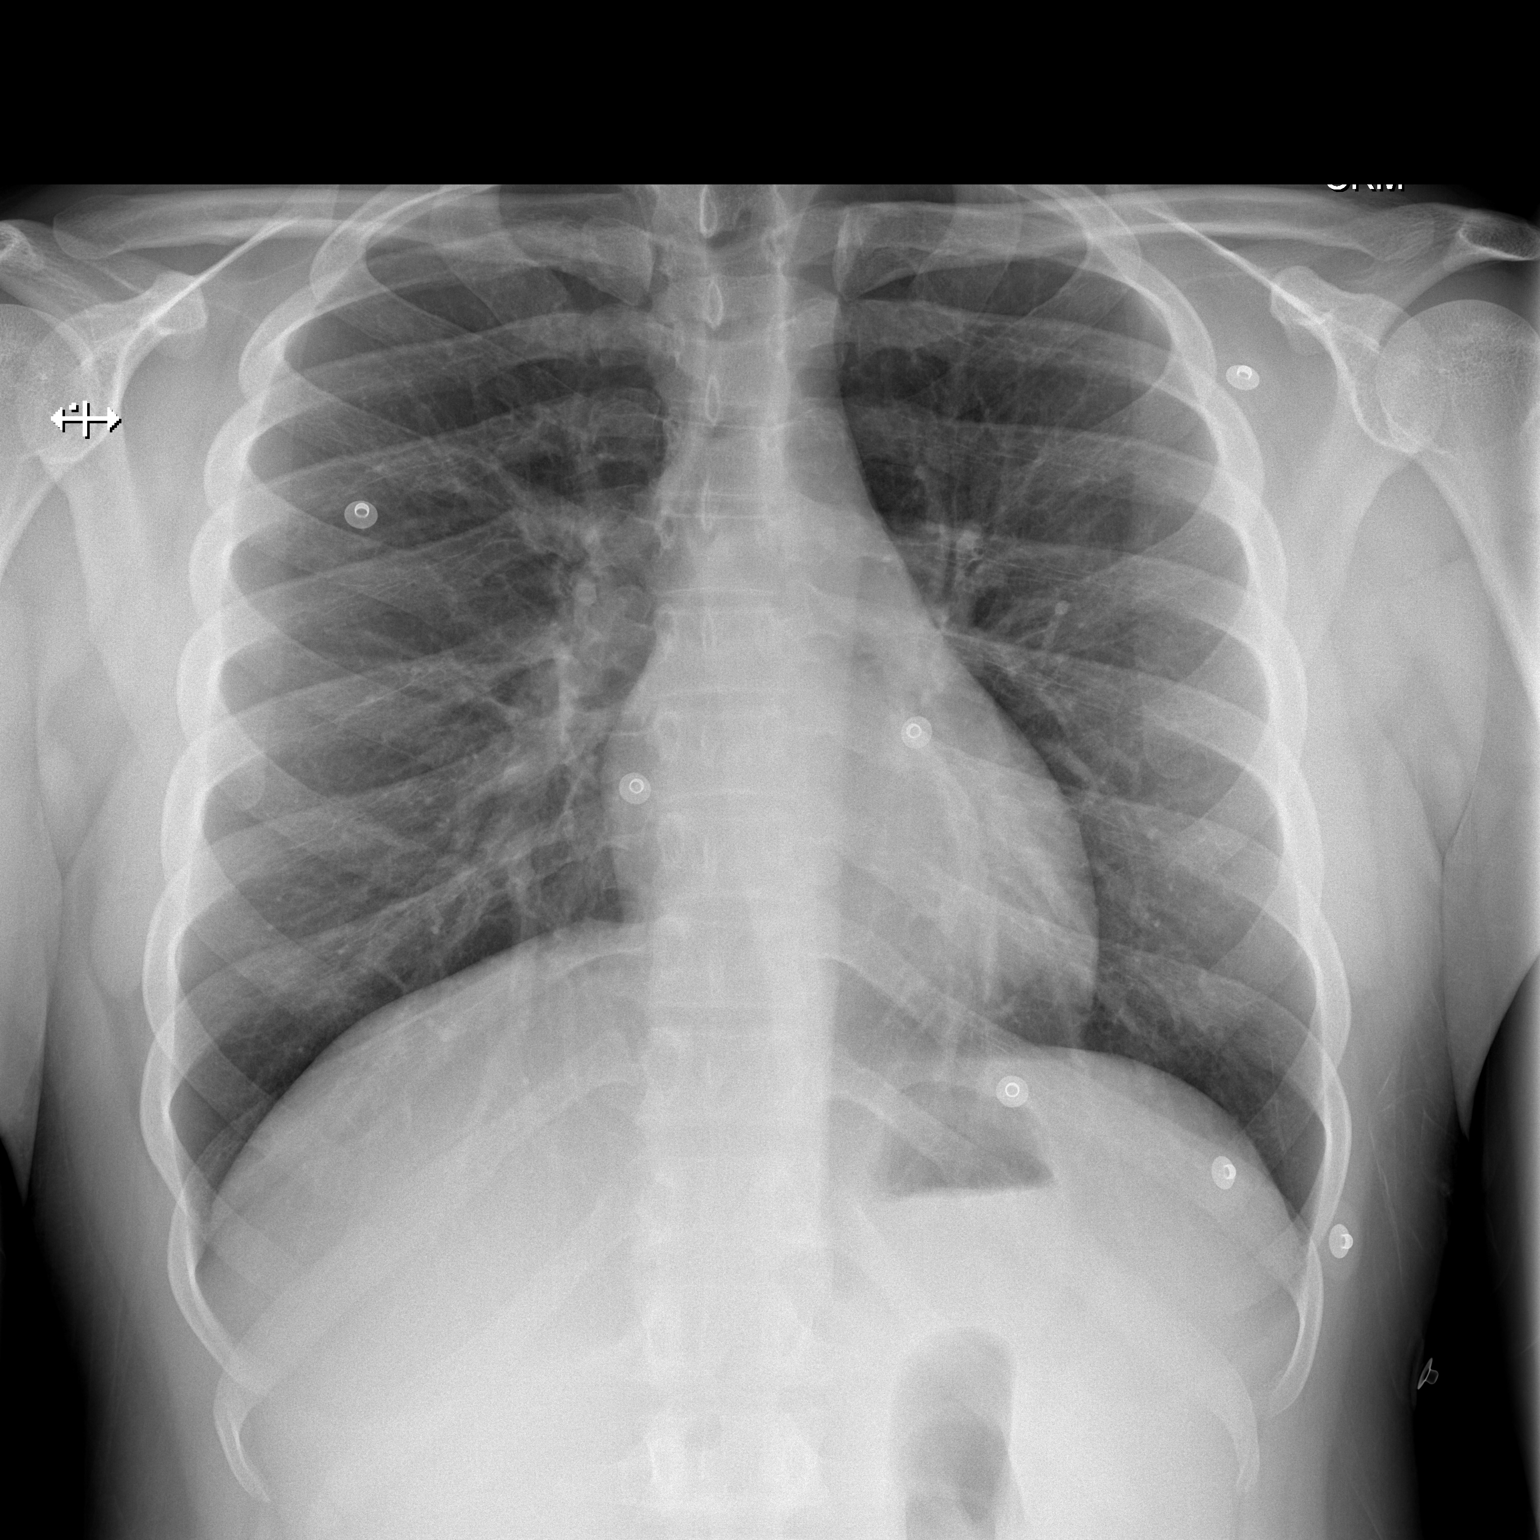

[1 of 1 positions shown; findings below may reference images not displayed]

FINDINGS: The heart size and mediastinal contours are within normal limits.
Both lungs are clear. The visualized skeletal structures are
unremarkable.
IMPRESSION: No active disease.

## 2024-05-17 ENCOUNTER — Ambulatory Visit: Payer: Self-pay

## 2024-05-17 NOTE — Telephone Encounter (Signed)
    Copied from CRM #[id number]. Topic: Clinical - Red Word Triage >> May 17, 2024  1:18 PM Emylou G wrote: [facility] that prompted transfer to Nurse Triage: Nebulizer last night but now coughing mucus with blood.. had asthma attack Tuesday.. shortness of breath wednesday Reason for Disposition  [1] MILD difficulty breathing (e.g., minimal/no SOB at rest, SOB with walking, pulse < 100) AND [2] still present when not coughing  (Exception: No change from usual, chronic shortness of breath.)  Answer Assessment - Initial Assessment Questions Additional info: Patient called in to schedule appointment to establish care and to evaluated hemoptysis. New patient appointment scheduled. Advised urgent care today for hemoptysis. Agreeable to urgent care.     1. ONSET: When did the cough begin?      Tuesday  2. SEVERITY: How bad is the cough today? Did the blood appear after a coughing spell?      Improving  3. SPUTUM: Describe the color of your sputum (e.g., none, dry cough; clear, white, yellow, green)     Blood  4. HEMOPTYSIS: How much blood? (e.g., flecks, streaks, tablespoons)     Streaks and occasional fully pink sputum.  5. DIFFICULTY BREATHING: Are you having difficulty breathing? If Yes, ask: How bad is it? (e.g., mild, moderate, severe)      Mild, speaking in full sentences on this call 6. FEVER: Do you have a fever? If Yes, ask: What is your temperature, how was it measured, and when did it start?     Denies 7. CARDIAC HISTORY: Do you have any history of heart disease? (e.g., heart attack, congestive heart failure)       8. LUNG HISTORY: Do you have any history of lung disease?  (e.g., pulmonary embolus, asthma, emphysema)     Asthma-on nebs with effect.  9. PE RISK FACTORS: Do you have a history of blood clots? Note: Other risk factors include recent major surgery, recent prolonged travel, being bedridden.      10. OTHER SYMPTOMS: Do you have any other symptoms?  (e.g., runny nose, wheezing, chest pain) Denies  Protocols used: Coughing Up Blood-A-AH

## 2024-07-04 ENCOUNTER — Telehealth: Payer: Self-pay | Admitting: Urgent Care

## 2024-07-04 NOTE — Telephone Encounter (Unsigned)
 Copied from CRM #[id number]. Topic: Clinical - Refused Triage >> Jul 04, 2024  4:00 PM Antwanette L wrote: Patient called to reschedule his appointment. He mentioned that his right ankle is causing him pain, but stated this has been an ongoing issue. I asked if he would like to speak with a nurse, and he declined.

## 2024-07-05 ENCOUNTER — Ambulatory Visit: Admitting: Urgent Care

## 2024-07-05 NOTE — Telephone Encounter (Signed)
 Pt is welcome to schedule earlier with me or go to [facility]

## 2024-07-05 NOTE — Telephone Encounter (Signed)
 Attempted call to patient. Left a voicemail message requesting a return call to assist in scheduling appt

## 2024-07-10 NOTE — Telephone Encounter (Signed)
 Attempted call to patient. Left a voice mail message requesting a return call.

## 2024-09-03 ENCOUNTER — Ambulatory Visit: Admitting: Urgent Care
